# Patient Record
Sex: Female | Born: 1935 | Race: White | Hispanic: No | Smoking: Never smoker
Health system: Southern US, Community
[De-identification: ages and names within clinical notes are randomized; demographics above are authoritative.]

---

## 2018-01-03 ENCOUNTER — Encounter (HOSPITAL_COMMUNITY): Payer: Self-pay | Admitting: Emergency Medicine

## 2018-01-03 ENCOUNTER — Emergency Department (HOSPITAL_COMMUNITY): Payer: Medicare PPO

## 2018-01-03 ENCOUNTER — Other Ambulatory Visit: Payer: Self-pay

## 2018-01-03 ENCOUNTER — Emergency Department (HOSPITAL_COMMUNITY)
Admission: EM | Admit: 2018-01-03 | Discharge: 2018-01-03 | Disposition: A | Discharge status: Home / Self Care | Payer: Medicare PPO | Attending: Emergency Medicine | Admitting: Emergency Medicine

## 2018-01-03 DIAGNOSIS — R1110 Vomiting, unspecified: Secondary | ICD-10-CM | POA: Diagnosis not present

## 2018-01-03 DIAGNOSIS — R42 Dizziness and giddiness: Secondary | ICD-10-CM | POA: Diagnosis present

## 2018-01-03 LAB — URINALYSIS, ROUTINE W REFLEX MICROSCOPIC
Bilirubin Urine: NEGATIVE
Glucose, UA: NEGATIVE mg/dL
Ketones, ur: NEGATIVE mg/dL
Nitrite: NEGATIVE
PROTEIN: NEGATIVE mg/dL
Specific Gravity, Urine: 1.014 (ref 1.005–1.030)
WBC, UA: >50 WBC/hpf — ABNORMAL HIGH (ref 0–5)
pH: 5 (ref 5.0–8.0)

## 2018-01-03 LAB — CBC WITH DIFFERENTIAL/PLATELET
Abs Immature Granulocytes: 0 10*3/uL (ref 0.0–0.1)
BASOS PCT: 1 %
Basophils Absolute: 0.1 10*3/uL (ref 0.0–0.1)
EOS ABS: 0 10*3/uL (ref 0.0–0.7)
EOS PCT: 1 %
HEMATOCRIT: 46.6 % — AB (ref 36.0–46.0)
Hemoglobin: 15.2 g/dL — ABNORMAL HIGH (ref 12.0–15.0)
Immature Granulocytes: 0 %
LYMPHS ABS: 2 10*3/uL (ref 0.7–4.0)
Lymphocytes Relative: 26 %
MCH: 29.4 pg (ref 26.0–34.0)
MCHC: 32.6 g/dL (ref 30.0–36.0)
MCV: 90.1 fL (ref 78.0–100.0)
MONOS PCT: 7 %
Monocytes Absolute: 0.6 10*3/uL (ref 0.1–1.0)
Neutro Abs: 5 10*3/uL (ref 1.7–7.7)
Neutrophils Relative %: 65 %
Platelets: 307 10*3/uL (ref 150–400)
RBC: 5.17 MIL/uL — ABNORMAL HIGH (ref 3.87–5.11)
RDW: 13.7 % (ref 11.5–15.5)
WBC: 7.7 10*3/uL (ref 4.0–10.5)

## 2018-01-03 LAB — COMPREHENSIVE METABOLIC PANEL
ALBUMIN: 4 g/dL (ref 3.5–5.0)
ALT: 12 U/L (ref 0–44)
AST: 19 U/L (ref 15–41)
Alkaline Phosphatase: 79 U/L (ref 38–126)
Anion gap: 10 (ref 5–15)
BUN: 10 mg/dL (ref 8–23)
CO2: 25 mmol/L (ref 22–32)
CREATININE: 0.78 mg/dL (ref 0.44–1.00)
Calcium: 9.5 mg/dL (ref 8.9–10.3)
Chloride: 105 mmol/L (ref 98–111)
GFR calc non Af Amer: >60 mL/min (ref >60)
Glucose, Bld: 110 mg/dL — ABNORMAL HIGH (ref 70–99)
Potassium: 3.8 mmol/L (ref 3.5–5.1)
Sodium: 140 mmol/L (ref 135–145)
Total Bilirubin: 0.7 mg/dL (ref 0.3–1.2)
Total Protein: 7.3 g/dL (ref 6.5–8.1)

## 2018-01-03 LAB — PROTIME-INR
INR: 1.04
PROTHROMBIN TIME: 13.5 s (ref 11.4–15.2)

## 2018-01-03 LAB — LIPASE, BLOOD: Lipase: 40 U/L (ref 11–51)

## 2018-01-03 MED ORDER — MECLIZINE HCL 25 MG PO TABS
25.0000 mg | ORAL_TABLET | Freq: Once | ORAL | Status: AC
  Administered 2018-01-03: 25 mg via ORAL
  Filled 2018-01-03: qty 1

## 2018-01-03 MED ORDER — MECLIZINE HCL 25 MG PO TABS: 25.0000 mg | ORAL_TABLET | Freq: Three times a day (TID) | ORAL | 0 refills | Status: AC | PRN

## 2018-01-03 NOTE — ED Notes (Signed)
RN Romeo AppleBen informed of pt BP

## 2018-01-03 NOTE — ED Notes (Signed)
Pt is aware a urine sample is needed no complaints noted at this time

## 2018-01-03 NOTE — Discharge Instructions (Addendum)
I have prescribed medication to treat vertigo, please take as needed.Please schedule an appointment with your Primary care physician for reevaluation of symptoms within 1 week. Please return to the ED if your symptoms worsen or you experience any of the following symptoms:  You have difficulty moving or speaking. You are always dizzy. You faint. You develop severe headaches. You have weakness in your hands, arms, or legs. You have changes in your hearing or vision. You develop a stiff neck. You develop sensitivity to light.

## 2018-01-03 NOTE — ED Provider Notes (Signed)
[facility] Provider Note   CSN: [id number] Arrival date & time: 01/03/18  1625     History   Chief Complaint Chief Complaint  Patient presents with  . Dizziness  . Emesis    HPI Krista Roy is a 84 y.o. female.  84 y.o female with no PMH resents to the ED with a chief complaint of dizziness, emesis x1 day.  States she opens her eye and watches the room move from side to side not spinning.  Reports is better when she keeps her eyes closed, does not move and hold her head with her hands.  She had a similar episode 2 weeks ago and she thought it was due to the sushi she had that night.  Patient reports she gets dizzy with movement and then vomits. Symptoms are exacerbated by movement. She denies any blood in her vomit.  He denies any chest pain, shortness of breath, dysuria, abdominal pain, headache or weakness.     History reviewed. No pertinent past medical history.  There are no active problems to display for this patient.   History reviewed. No pertinent surgical history.   OB History   None      Home Medications    Prior to Admission medications   Not on File    Family History No family history on file.  Social History Social History   Tobacco Use  . Smoking status: Never Smoker  . Smokeless tobacco: Never Used  Substance Use Topics  . Alcohol use: Never    Frequency: Never  . Drug use: Never     Allergies   Patient has no allergy information on record.   Review of Systems Review of Systems  Constitutional: Negative for chills and fever.  HENT: Negative for ear pain and sore throat.   Eyes: Negative for pain and visual disturbance.  Respiratory: Negative for cough and shortness of breath.   Cardiovascular: Negative for chest pain and palpitations.  Gastrointestinal: Negative for abdominal pain and vomiting.  Genitourinary: Negative for dysuria and hematuria.  Musculoskeletal: Negative for arthralgias  and back pain.  Skin: Negative for color change and rash.  Neurological: Positive for dizziness. Negative for seizures, syncope, weakness, light-headedness, numbness and headaches.  All other systems reviewed and are negative.    Physical Exam Updated Vital Signs BP (!) 184/81 (BP Location: Right Arm)   Pulse 94   Temp 99.2 F (37.3 C) (Oral)   Resp 18   Ht 5\' 5"  (1.651 m)   Wt 63.5 kg   SpO2 96%   BMI 23.30 kg/m   Physical Exam  Constitutional: She is oriented to person, place, and time. She appears well-developed and well-nourished.  HENT:  Head: Normocephalic and atraumatic.  Eyes: Pupils are equal, round, and reactive to light. Right conjunctiva is injected. Left conjunctiva is not injected. Right eye exhibits nystagmus. Right eye exhibits normal extraocular motion. Left eye exhibits nystagmus. Left eye exhibits normal extraocular motion.  BL nystagmus noted. 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   Neck: Normal range of motion. Neck supple.  Cardiovascular: Normal heart sounds.  Pulmonary/Chest: Effort normal and breath sounds normal. She has no wheezes.  Abdominal: Soft. Bowel sounds are normal. There is no tenderness.  Musculoskeletal: She exhibits no tenderness or deformity.  Neurological: She is alert and oriented to person, place, and time.  Skin: Capillary refill takes less than 2 seconds.  Nursing note and vitals reviewed.    ED Treatments / Results  Labs (all labs ordered are listed, but only abnormal results are displayed) Labs Reviewed  CBC WITH DIFFERENTIAL/PLATELET - Abnormal; Notable for the following components:      Result Value   RBC 5.17 (*)    Hemoglobin 15.2 (*)    HCT 46.6 (*)    All other components within normal limits  URINALYSIS, ROUTINE W REFLEX MICROSCOPIC - Abnormal; Notable for the following components:   APPearance HAZY (*)    Hgb urine dipstick SMALL (*)    Leukocytes, UA LARGE (*)    WBC, UA >50 (*)    Bacteria, UA RARE (*)    Non  Squamous Epithelial 0-5 (*)    All other components within normal limits  COMPREHENSIVE METABOLIC PANEL - Abnormal; Notable for the following components:   Glucose, Bld 110 (*)    All other components within normal limits  URINE CULTURE  LIPASE, BLOOD  PROTIME-INR    EKG None  Radiology Dg Chest 2 View  Result Date: 01/03/2018 CLINICAL DATA:  Dizziness and projectile vomiting. EXAM: CHEST - 2 VIEW COMPARISON:  None. FINDINGS: The heart size and mediastinal contours are within normal limits. There is no focal infiltrate, pulmonary edema, or pleural effusion. There is scoliosis of spine with degenerative joint changes of spine. IMPRESSION: No acute abnormality.  No focal pneumonia. Electronically Signed   By: Sherian Rein M.D.   On: 01/03/2018 18:16   Ct Head Wo Contrast  Result Date: 01/03/2018 CLINICAL DATA:  New onset of dizziness.  Nausea and vomiting. EXAM: CT HEAD WITHOUT CONTRAST TECHNIQUE: Contiguous axial images were obtained from the base of the skull through the vertex without intravenous contrast. COMPARISON:  None. FINDINGS: Brain: No acute infarct, hemorrhage, or mass lesion is present. The ventricles are of normal size. No significant extraaxial fluid collection is present. Mild white matter changes are within normal limits for age. The brainstem and cerebellum are normal. Vascular: Atherosclerotic calcifications are present within the cavernous internal carotid arteries bilaterally. There is no hyperdense vessel. Skull: Calvarium is intact. No focal lytic or blastic lesions are present. Sinuses/Orbits: The paranasal sinuses and mastoid air cells are clear. Globes and orbits are within normal limits. IMPRESSION: 1. Normal CT appearance of the brain for age. Electronically Signed   By: Marin Roberts M.D.   On: 01/03/2018 18:06    Procedures Procedures (including critical care time)  Medications Ordered in ED Medications  meclizine (ANTIVERT) tablet 25 mg (25 mg Oral  Given 01/03/18 2118)     Initial Impression / Assessment and Plan / ED Course  I have reviewed the triage vital signs and the nursing notes.  Pertinent labs & imaging results that were available during my care of the patient were reviewed by me and considered in my medical decision making (see chart for details).    Patient presents with positional dizziness x 1 day.  States the dizziness is worse with movement but better when she closes her eyes. CMP showed no electrolyte abnormality, values are within normal limits. CBC slight elevation os RBC,and H&H.  UA showed leukocytes, WBC > 50. And rare bacteria. Urine will be sent for culture. CT head showed no acute infarct, hemorrhage or mass. DG chest 2 view showed no consolidation, pneumothorax or pneumonia.   Antivert given, will reevaluate patient. Patient's condition has improved, will send her home with antivert medication and PCP follow up within 1 week for reevaluation of symptoms.Return precautions provided.   Final Clinical Impressions(s) / ED Diagnoses   Final  diagnoses:  Vertigo  Dizziness    ED Discharge Orders    None       Claude Manges, Cordelia Poche 01/03/18 2212    Marily Memos, MD 01/03/18 2352

## 2018-01-03 NOTE — ED Notes (Signed)
Signature pad not available. Discharge instructions reviewed and patient verbalized understanding.

## 2018-01-03 NOTE — ED Triage Notes (Signed)
Pt to ED with c/o dizziness and vomiting.  Pt st's she had same type symptoms 1 week ago but it subsided.  Pt st's she started having the symptoms again yesterday.  Pt alert and oriented x's 3.

## 2018-01-03 NOTE — ED Provider Notes (Addendum)
Medical screening examination/treatment/procedure(s) were conducted as a shared visit with non-physician practitioner(s) and myself.  I personally evaluated the patient during the encounter.   Sensation of the room spinning associated with some nausea intermittently for the last couple days.  Worse with movement.  Does happen every time she moves but often.  Extinguishes quickly when sitting still.  No chest pain or shortness of breath associated with it.  No syncope. Exam ears are clear, neurologic exam is intact.  Moving her eyes to the left seems to elicit the symptoms. Seems like likely peripheral vertigo.  Blood pressures slightly elevated here however I still think is unlikely to be a central cause for her symptoms.  Will start meclizine with PCP follow-up in ENT follow-up if not improving in a week   Savoy [place], Barbara CowerJason, MD 01/03/18 2351    EKG Interpretation  Date/Time:  Friday January 03 2018 16:56:06 EDT Ventricular Rate:  101 PR Interval:  154 QRS Duration: 70 QT Interval:  346 QTC Calculation: 448 R Axis:   17 Text Interpretation:  Sinus tachycardia Otherwise normal ECG No old tracing to compare Confirmed by Marily MemosMesner, Toryn Dewalt [phone]([id number]) on 01/03/2018 11:52:26 PM         Saddie Sandeen, Barbara CowerJason, MD 01/03/18 2352

## 2018-01-04 LAB — URINE CULTURE

## 2018-12-10 IMAGING — CT CT HEAD W/O CM
4 series · 16 of 47 positions shown, 18 images · non-contrast
Comparison: None.

CLINICAL DATA: New onset of dizziness.  Nausea and vomiting.

EXAM:
CT HEAD WITHOUT CONTRAST
TECHNIQUE: Contiguous axial images were obtained from the base of the skull
through the vertex without intravenous contrast.

[Series 2: head without · axial · non-contrast · 0.45mm/px · z∈[-73,+47]mm · 7 of 32 slices shown, 9 images]
[im 4/32  brain]
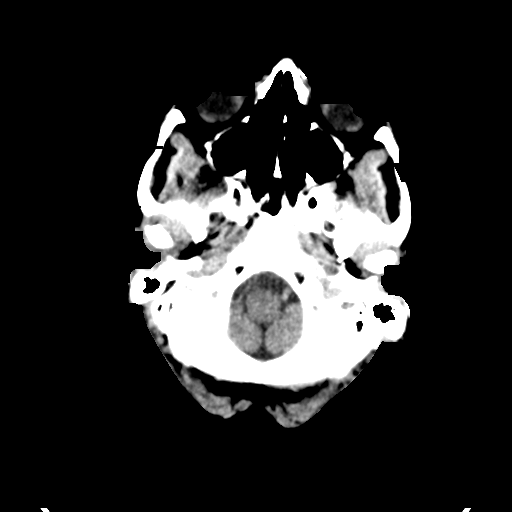
[im 4/32  bone]
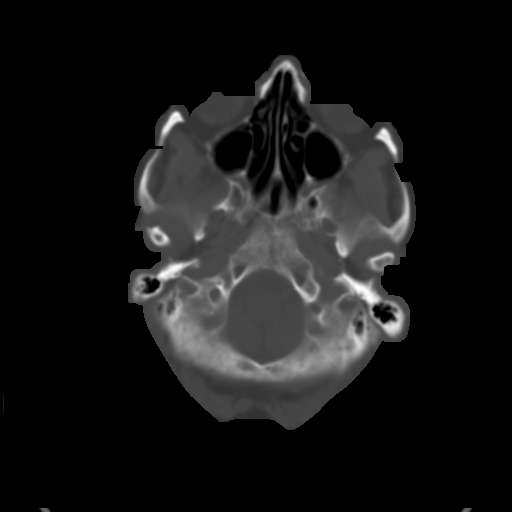
[im 8/32  brain]
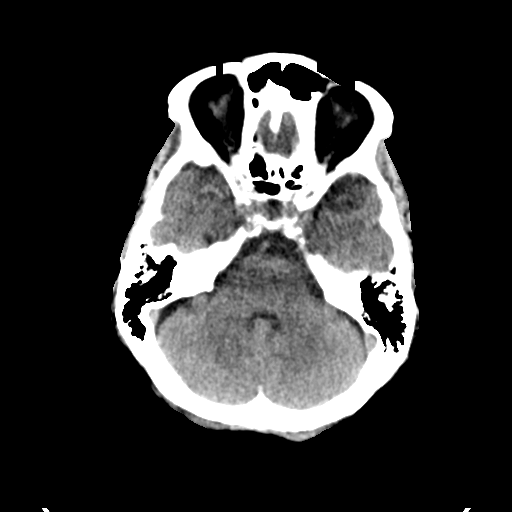
[im 12/32  brain]
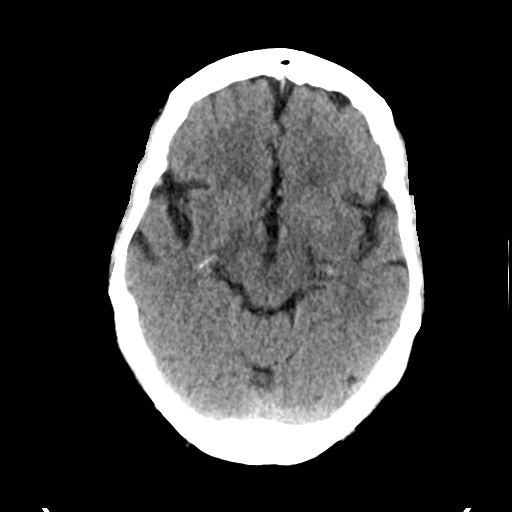
[im 16/32  brain]
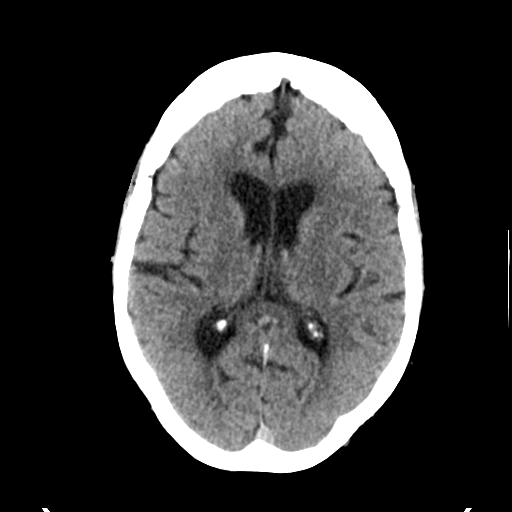
[im 20/32  brain]
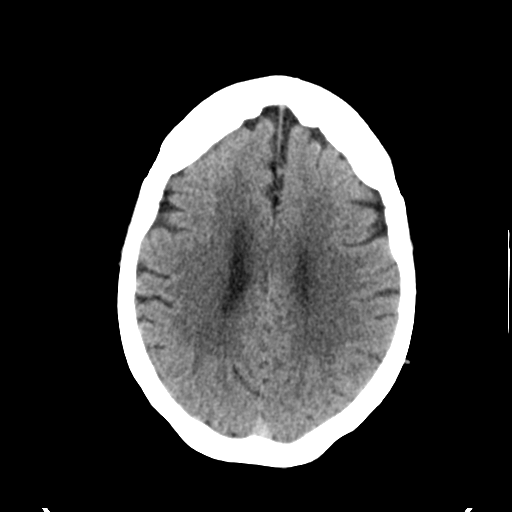
[im 20/32  bone]
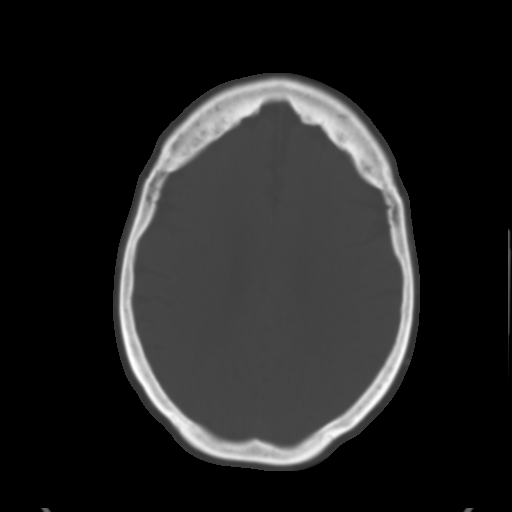
[im 24/32  brain]
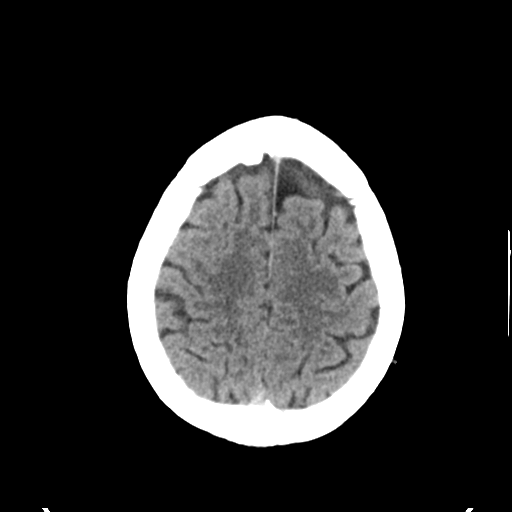
[im 28/32  brain]
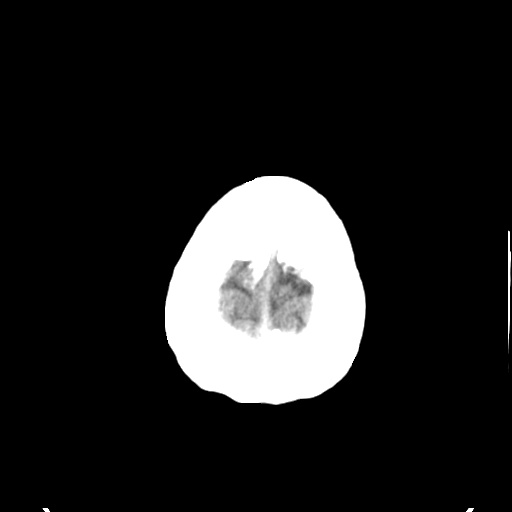

[Series 3: head bone · axial · 0.45mm/px · z∈[-74,-42]mm · 3 of 78 slices shown]
[im 8/78  bone]
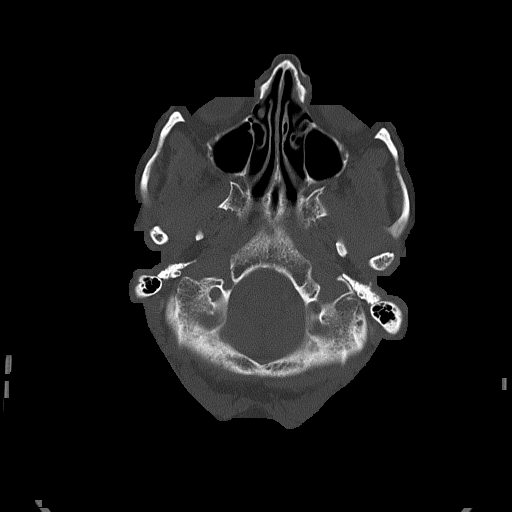
[im 16/78  bone]
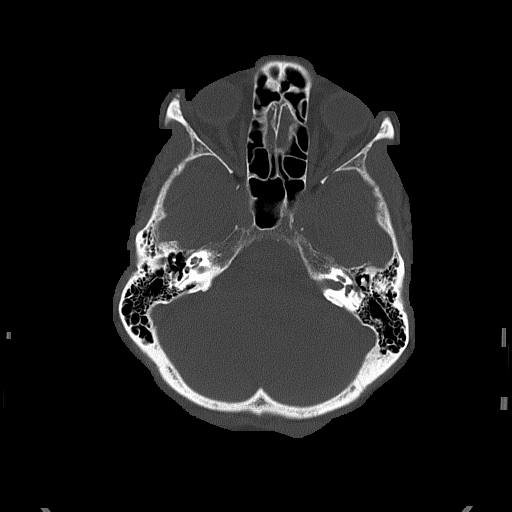
[im 24/78  bone]
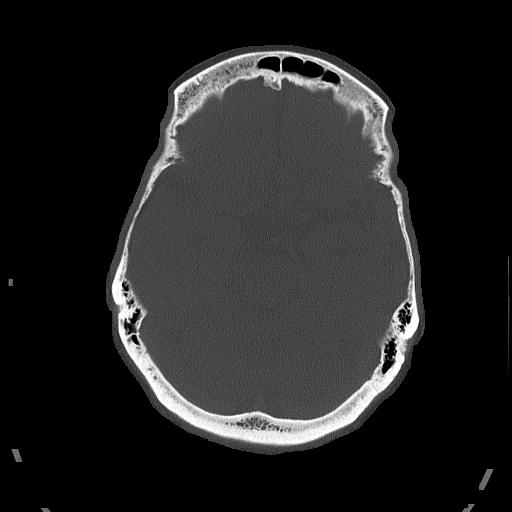

[Series 4: head without cor · coronal · non-contrast · 0.30mm/px · 3 of 69 slices shown]
[im 23/69  brain]
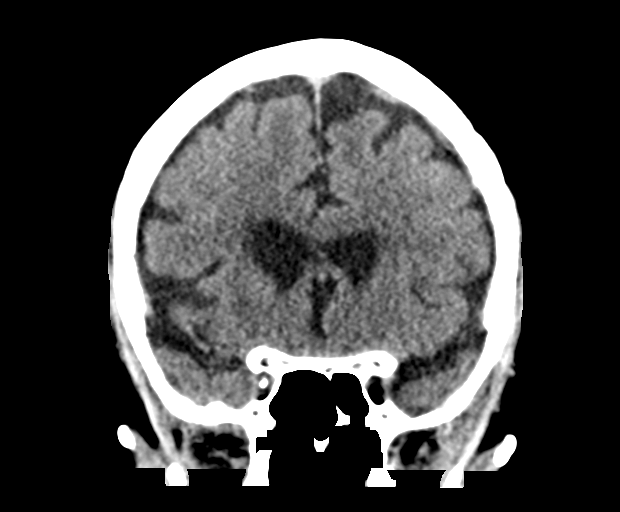
[im 31/69  brain]
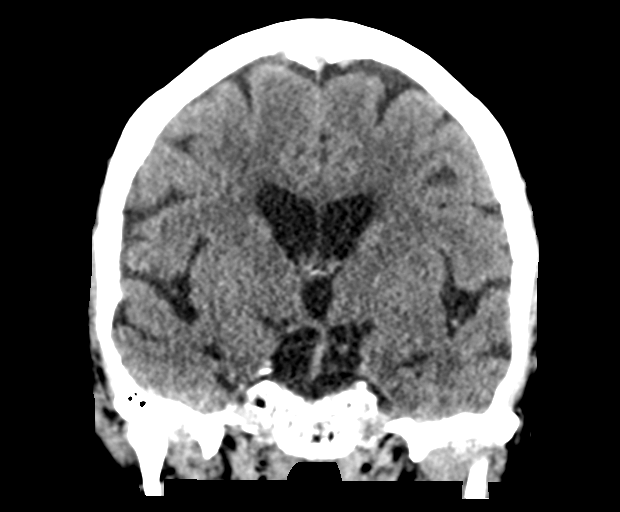
[im 38/69  brain]
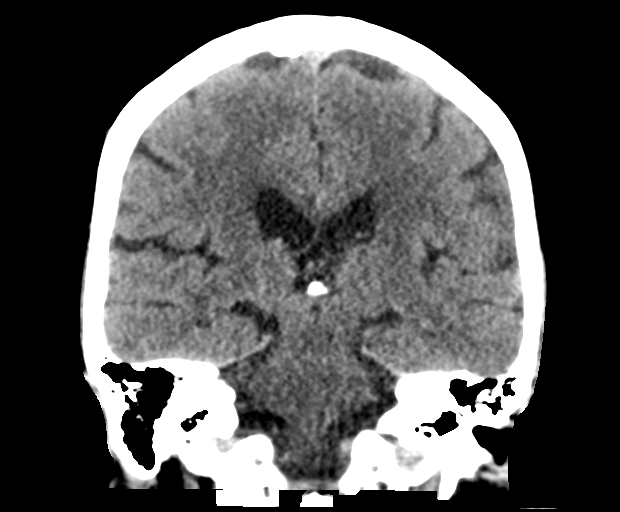

[Series 5: head without sag · sagittal · non-contrast · 0.35mm/px · 3 of 67 slices shown]
[im 23/67  brain]
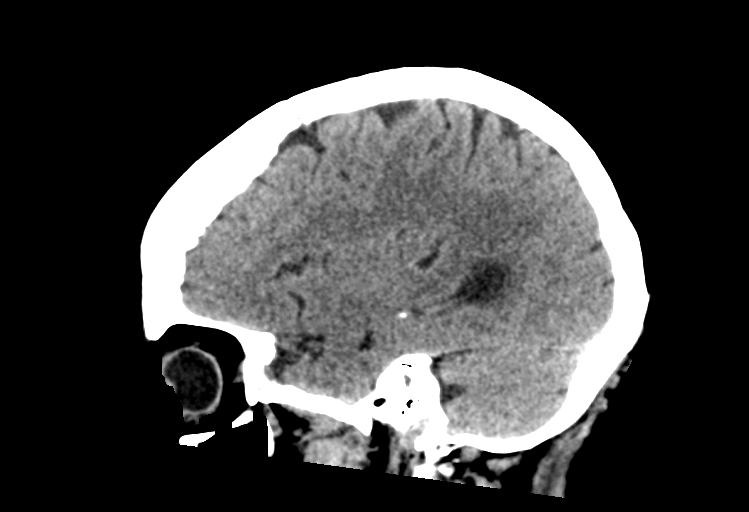
[im 34/67  brain]
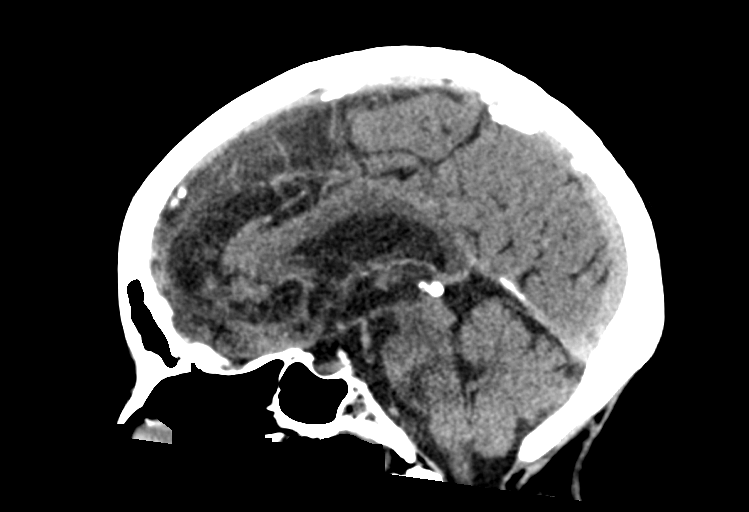
[im 45/67  brain]
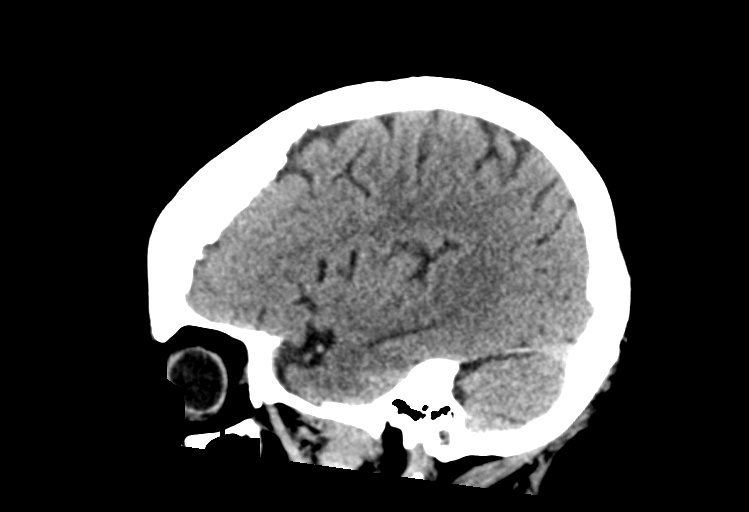

[16 of 47 positions shown; findings below may reference images not displayed]

FINDINGS: Brain: No acute infarct, hemorrhage, or mass lesion is present. The
ventricles are of normal size. No significant extraaxial fluid
collection is present.

Mild white matter changes are within normal limits for age. The
brainstem and cerebellum are normal.

Vascular: Atherosclerotic calcifications are present within the
cavernous internal carotid arteries bilaterally. There is no
hyperdense vessel.

Skull: Calvarium is intact. No focal lytic or blastic lesions are
present.

Sinuses/Orbits: The paranasal sinuses and mastoid air cells are
clear. Globes and orbits are within normal limits.
IMPRESSION: 1. Normal CT appearance of the brain for age.

## 2023-05-13 ENCOUNTER — Telehealth (INDEPENDENT_AMBULATORY_CARE_PROVIDER_SITE_OTHER): Payer: Self-pay | Admitting: Otolaryngology

## 2023-05-13 NOTE — Telephone Encounter (Signed)
We received a fax stating Georgiana Spinner was the Health Care POA for this patient.  The fax was hard to read and was not witnessed or notarized and therefore can not be accepted.  Georgiana Spinner called today to follow up on the paperwork and was asked to bring a notarized copy of the paperwork to the appt on 05/17/23 w/ Dr. Irene Pap and she agreed to do so.

## 2023-05-16 ENCOUNTER — Institutional Professional Consult (permissible substitution) (INDEPENDENT_AMBULATORY_CARE_PROVIDER_SITE_OTHER): Payer: Medicare PPO | Admitting: Otolaryngology

## 2023-05-17 ENCOUNTER — Ambulatory Visit (INDEPENDENT_AMBULATORY_CARE_PROVIDER_SITE_OTHER): Payer: Medicare HMO | Admitting: Otolaryngology

## 2023-05-17 ENCOUNTER — Encounter (INDEPENDENT_AMBULATORY_CARE_PROVIDER_SITE_OTHER): Payer: Self-pay | Admitting: Otolaryngology

## 2023-05-17 VITALS — BP 179/73 | HR 84 | Ht 65.0 in | Wt 142.0 lb

## 2023-05-17 DIAGNOSIS — R09A2 Foreign body sensation, throat: Secondary | ICD-10-CM | POA: Diagnosis not present

## 2023-05-17 DIAGNOSIS — K219 Gastro-esophageal reflux disease without esophagitis: Secondary | ICD-10-CM

## 2023-05-17 DIAGNOSIS — R0981 Nasal congestion: Secondary | ICD-10-CM

## 2023-05-17 DIAGNOSIS — R1310 Dysphagia, unspecified: Secondary | ICD-10-CM

## 2023-05-17 DIAGNOSIS — R053 Chronic cough: Secondary | ICD-10-CM

## 2023-05-17 DIAGNOSIS — J342 Deviated nasal septum: Secondary | ICD-10-CM

## 2023-05-17 DIAGNOSIS — J3089 Other allergic rhinitis: Secondary | ICD-10-CM

## 2023-05-17 DIAGNOSIS — R0989 Other specified symptoms and signs involving the circulatory and respiratory systems: Secondary | ICD-10-CM

## 2023-05-17 DIAGNOSIS — R0982 Postnasal drip: Secondary | ICD-10-CM | POA: Diagnosis not present

## 2023-05-17 DIAGNOSIS — J300 Vasomotor rhinitis: Secondary | ICD-10-CM

## 2023-05-17 MED ORDER — IPRATROPIUM BROMIDE 0.03 % NA SOLN: 2.0000 | Freq: Two times a day (BID) | NASAL | 12 refills | Status: AC

## 2023-05-17 MED ORDER — FLUTICASONE PROPIONATE 50 MCG/ACT NA SUSP: 2.0000 | Freq: Every day | NASAL | 6 refills | Status: AC

## 2023-05-17 NOTE — Progress Notes (Unsigned)
ENT CONSULT:  Reason for Consult: chronic cough for 1 year    HPI: Discussed the use of AI scribe software for clinical note transcription with the patient, who gave verbal consent to proceed.  History of Present Illness   The patient, a 89 year old individual residing in an assisted living facility, presents with a chronic cough of nearly one year's duration, accompanied by family member. The cough is described as productive, with the patient reporting significant amounts of white or clear mucus. The patient notes that the mucus accumulates particularly when lying down, leading to increased coughing episodes at night. The cough is also reported to worsen after meals, although there is no reported difficulty swallowing or choking with food or liquids.  The patient has a history of COVID-19 infection, which occurred several months prior to the onset of the cough. She also reports a history of childhood asthma, which resolved by the time she started kindergarten. There is no reported history of heartburn or reflux, and no known allergies.  The patient has sought multiple medical opinions for this issue, including visits to the emergency room, consultations with two pulmonologists, a cardiologist. Various treatments have been tried, including prednisone, albuterol, Mucinex, and Flonase, with no significant improvement. The patient has declined bronchoscopy when seeing Pulmonary. She is not interested in anything invasive for workup or interventions.   The patient also reports a correlation between the severity of the cough and periods of emotional stress. She has recently started taking a small dose of Xanax, which she reports has improved her sleep. Night time is when cough used to wake her up at night.   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 Despite the chronic cough and mucus production, lung examinations have reportedly been clear. She had normal PFTs and no  concerning findings on CT chest per report review.   The patient's primary concern is the mucus production, which she describes as the cause of the cough. She reports that leaning forward significantly helps to expel the mucus. Despite the chronic nature of the cough, it is not reported to be disruptive to daily activities outside of the patient's room.     Records Reviewed:  04/01/23 History of Present Illness: Krista Roy is an 89 y.o. female former smoker who presents for evaluation and management of chronic productive cough since Feb 2024. She has been evaluated by outside pulmonary. CT chest was without parenchymal lung disease or bronchiectasis. PFTs showed reduced FEV1 and FVC normal ratio. She has tried prednisone, albuterol, and mucinex. None have worked. She is under a bit of stress which she feels makes it worse. She does not feel that she is aspirating. She notes that eating and drinking makes the cough better. She gets clear to white mucus out and feels relief when she does. It is worse at night. She is using flonase. She has been evaluated by cardiology and they do not feel this is related to her heart.   Interval Visit 04/01/2023: Since our last visit she completed a course of Levaquin without improvement. Flutter valve helps a little but she still reports a lot of mucus that feels stuck in her throat. She is using flonase every night and nasal saline spray. She had an episode of sneezing recently that gave her complete relief when all of the mucus came out. We discussed referral to ENT and she agrees. We discussed bronchoscopy and risks associated and she agreed to defer. She notes that  her son had a bronchoscopy and ended up with tracheal fracture and tracheostomy. She does not ever wish to have a bronchoscopy.   Assessment and Plan:   1. Cough, chronic and productive - advised flonase and Zyrtec before bed. - flutter valve 10 times twice a day - referral to ENT placed and  faxed to preferred group [phone] - recommend nasal rinses   RTC in 3 months   ED visit note 02/02/23 Shortness of Breath  Pt BIB EMS from [facility] - Pt reports persistent, productive cough x6 mo - Pt has been ruled out for pneumonia and has been on two doses of antibiotics with little to no improvement - Pt reports that she is unable to get much rest and reprots that SOB and tightness is worse with laying down - Pt is on no O2 at baseline - EMS reports wheezing and rhonchi - Gave 5mg  Albuterol and 0.5 of Atrovent en route   89 year old female presents brought in by EMS from [facility] reporting persistent productive cough x 6 months. Has been treated for pneumonia with 2 rounds of antibiotic with no improvement. Reports she is unable to get much sleep at night. She starts feeling she has a lot of phlegm and mucus in her chest and throat. This causes her to be short of breath and also causes difficulty sleeping. Reports it sounds like her lungs are usually very noisy. She received 5 mg albuterol and 0.5 of Atrovent en route from EMS, reports this is the best she is felt in a long time. Works much better than her nebulizers at home. No baseline O2. No chest pain, abdominal pain, nausea or vomiting.   Office visit with [facility] 10/16/22 [facility] Chest Specialist   Plan  1. Chronic cough (Primary) - Cough started ~ 4 months ago. Worse at night and also occurs with eating foods. No medication changes. - Recently seen in the ED on 10/01/2022 where she received further work-up of cough and SOB with the following: *CTPA - no PEs. Portions of the right lower lobe segmental artery are obscured by motion artifact  *Dopplers of lower extremities - no DVTs *CXR - no acute lung findings  *ABG - pH 7.41, CO2 41, O2 75, HCO3- 26. *Tropinins - 14 then 10 after 3 hrs. *CBC, BMP - Normal overall - Spirometry (PFT) - suspected lung restriction. FEV1/FVC 97%, FEV1 60% pred, FVC 61% pred - Diffusing capacity  (DLCO) - severe reduction in diffusing capacity. DLCO 41% pred - Patient declined wearing supplemental oxygen, so was not performed. - Consider referral to GI to assess swallow if symptoms continue. - Echocardiogram Complete WO Enhancing Agent - Patient will start fluticasone furoate (ARNUITY ELLIPTA) 200 mcg/actuation inhalation powder - The proper method of use, as well as anticipated side effects, of this metered-dose inhaler are discussed and demonstrated to the patient. - albuterol sulfate HFA (VENTOLIN HFA) 108 (90 Base) MCG/ACT inhaler  2. Lower extremity edema - Echocardiogram Complete WO Enhancing Agent - If Echo results are abnormal, will refer to cardiology  Follow up in about 3 months (around 01/16/2023) for Medication check. (patient will call sooner if symptoms change).  HPI  Chief Complaint: Cough  Alvera Ransier is a 89 y.o. female who presents for a new visit/consultation regarding cough and SOB . Mrs. Yarbrough was referred by Rodalyn Dwain Sarna, PA. Ms. Mckenna cough started ~ 4 months ago shortly after a visit from her son-in-law. The cough has occurred off and on during this  time. Along with coughing, she reports some wheezing, congestion, LEE, some issues with cough after eating, and shortness of breath. Her symptoms are typically worse at night. No symptoms related to acid reflux or recent medication changes. She was recently seen at urgent care for these symptoms and was sent to the ED for further work-up. CTPA, dopplers of lower extremities, and CXR were all normal. PFTs completed today illustrate lung restriction. DLCO shows severe reduction in diffusing capacity. Ms. Sannes has no known history of occupational exposures or connective tissue disorders. No family history of pulmonary diseases or lung cancer. She had a brief smoking history when she was a teenager.   I have recommended starting Arnuity daily inhaler therapy and PRN Albuterol. We have placed an order  for an echo to be completed to assess heart valves. Ms. Copado should continue wearing support hose to help with LEE. If symptoms continue, we will plan to refer her to GI for swallow eval and cardiology for complete cardiac work-up.  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 Technique rectified with demonstration. Patient verbalized understanding.  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 No pertinent surgical history.  Social History   Substance and Sexual Activity  Alcohol Use Not Currently   Social History   Tobacco Use  Smoking Status Former  Packs/day: 0.50  Years: 8.00  Additional pack years: 0.00  Total pack years: 4.00  Types: Cigarettes  Start date: 27  Quit date: 61  Years since quitting: 69.4  Smokeless Tobacco Never    Social History:  reports that she has never smoked. She has never used smokeless tobacco. She reports that she does not drink alcohol and does not use drugs.  Tobacco Use  Smoking Status Former  Packs/day: 0.50  Years: 8.00  Additional pack years: 0.00  Total pack years: 4.00  Types: Cigarettes  Start date: 76  Quit date: 22  Years since quitting: 69.7  Smokeless Tobacco Never    Allergies: Not on File  Medications: I have reviewed the patient's current medications.  The PMH, PSH, Medications, Allergies, and SH were reviewed and updated.  ROS: Constitutional: Negative for fever, weight loss and weight gain. Cardiovascular: Negative for  chest pain and dyspnea on exertion. Respiratory: Is not experiencing shortness of breath at rest. Gastrointestinal: Negative for nausea and vomiting. Neurological: Negative for headaches. Psychiatric: The patient is not nervous/anxious  Blood pressure (!) 179/73, pulse 84, height 5\' 5"  (1.651 m), weight 142 lb (64.4 kg), SpO2 93%.  PHYSICAL EXAM:  Exam: General: Well-developed, well-nourished Communication and Voice: Clear pitch and clarity Respiratory Respiratory effort: Equal inspiration and expiration without stridor Cardiovascular Peripheral Vascular: Warm extremities with equal color/perfusion Eyes: No nystagmus with equal extraocular motion bilaterally Neuro/Psych/Balance: Patient oriented to person, place, and time; Appropriate mood and affect; Gait is intact with no imbalance; Cranial nerves I-XII are intact Head and Face Inspection: Normocephalic and atraumatic without mass or lesion Palpation: Facial skeleton intact without bony stepoffs Salivary Glands: No mass or tenderness Facial Strength: Facial motility symmetric and full bilaterally ENT  Pinna: External ear intact and fully developed External canal: Canal is patent with intact skin Tympanic Membrane: Clear and mobile External Nose: No scar or anatomic deformity Internal Nose: Septum is ***. No polyp, or purulence. Mucosal edema and erythema present.  Bilateral inferior turbinate hypertrophy.  Lips, Teeth, and gums: Mucosa and teeth intact and viable TMJ: No pain to palpation with full mobility Oral cavity/oropharynx: No erythema or exudate, no lesions present Nasopharynx: No mass or lesion with intact mucosa Hypopharynx: Intact mucosa without pooling of secretions Larynx Glottic: Full true vocal cord mobility without lesion or mass Supraglottic: Normal appearing epiglottis and AE folds Interarytenoid Space: Moderate pachydermia&edema Subglottic Space: Patent without lesion or edema Neck Neck and Trachea:  Midline trachea without mass or lesion Thyroid: No mass or nodularity Lymphatics: No lymphadenopathy  Procedure:  Preoperative diagnosis:  Postoperative diagnosis:   Same  Procedure: Flexible fiberoptic laryngoscopy  Surgeon: Ashok Croon, MD  Anesthesia: Topical lidocaine and Afrin Complications: None Condition is stable throughout exam  Indications and consent:  The patient presents to the clinic with Indirect laryngoscopy view was incomplete. Thus it was recommended that they undergo a flexible fiberoptic laryngoscopy. All of the risks, benefits, and potential complications were reviewed with the patient preoperatively and verbal informed consent was obtained.  Procedure: The patient was seated upright in the clinic. Topical lidocaine and Afrin were applied to the nasal cavity. After adequate anesthesia had occurred, I then proceeded to pass the flexible telescope into the nasal cavity. The nasal cavity was patent without rhinorrhea or polyp. The nasopharynx was also patent without mass or lesion. The base of tongue was visualized and was normal. There were no signs of pooling of secretions in the piriform sinuses. The true vocal folds were mobile bilaterally. There were no signs of glottic or supraglottic mucosal lesion or mass. There was *** interarytenoid pachydermia and post cricoid edema. The telescope was then slowly withdrawn and the patient tolerated the procedure throughout.    PROCEDURE NOTE: nasal endoscopy  Preoperative diagnosis: chronic sinusitis symptoms  Postoperative diagnosis: same  Procedure: Diagnostic nasal endoscopy (40981)  Surgeon: Ashok Croon, M.D.  Anesthesia: Topical lidocaine and Afrin  H&P REVIEW: The patient's history and physical were reviewed today prior to procedure. All medications were reviewed and updated as well. Complications: None Condition is stable throughout exam Indications and consent: The patient presents with symptoms of  chronic sinusitis not responding to previous therapies. All the risks, benefits, and potential complications were reviewed with the patient preoperatively and informed consent was obtained. The time out was completed with confirmation of the correct procedure.   Procedure: The patient was seated upright in the clinic. Topical lidocaine and Afrin were applied to the nasal cavity. After adequate anesthesia had occurred, the rigid nasal endoscope was passed into the nasal cavity. The nasal mucosa, turbinates, septum, and sinus drainage pathways were visualized bilaterally. This revealed *** purulence or significant secretions that might be cultured. There were *** polyps or sites of significant inflammation. The mucosa was intact and there *** crusting present. The scope was then slowly withdrawn and the patient tolerated the procedure well. There were no complications or blood loss.     Studies Reviewed: CT chest - report RADIOLOGY CT Chest done outside 10/01/2022 - images not available for review  TRACHEA: The central tracheobronchial tree is patent. PARENCHYMA: No focal consolidative airspace opacities are seen.  PLEURA: No discrete pleural effusions are seen. There is no evidence of a pneumothorax.   Assessment/Plan: Encounter Diagnoses  Name Primary?   Dysphagia, unspecified type    Chronic cough Yes   Globus sensation    Chronic GERD    Chronic throat clearing    Post-nasal drip    Chronic nasal congestion    Environmental and seasonal allergies    Vasomotor rhinitis     Assessment and Plan    Chronic Cough   Chronic cough persisting for nearly a year, primarily after eating and at night when she lies down, and exacerbated by mucus production. Sensation of mucus is the main issue for her and she constantly wants to clear her throat or cough.   From extensive prior workup with Pulm and Cards:  "Spirometry (PFT) - suspected lung restriction. FEV1/FVC 97%, FEV1 60% pred, FVC 61%  pred - Diffusing capacity (DLCO) - severe reduction in diffusing capacity. DLCO 41% pred - Patient declined wearing supplemental oxygen, so was not performed. - Consider referral to GI to assess swallow if symptoms continue. - Echocardiogram Complete WO Enhancing Agent - Patient will start fluticasone furoate (ARNUITY ELLIPTA) 200 mcg/actuation inhalation powder - The proper method of use, as well as anticipated side effects, of this metered-dose inhaler are discussed and demonstrated to the patient. - albuterol sulfate HFA (VENTOLIN HFA) 108 (90 Base) MCG/ACT inhaler"  She does have CHF and wears compression socks for leg edema. No evidence of asthma or significant lung disease on imaging and pulmonary function tests. ENT evaluation revealed mild swelling above the esophageal sphincter, suggesting possible reflux. Differential includes postnasal drainage, untreated reflux, and neurogenic cough. Discussed esophagram for evaluating swallow safety and potential hiatal hernia. Explained barium swallow test is well-tolerated with minimal gastrointestinal side effects. Discussed neurogenic cough and stress exacerbation. Patient expressed concerns about further testing given her age and prognosis.   - Order esophagram to evaluate swallow safety and check for hiatal hernia   - Prescribe ipratropium bromide nasal spray with Flonase   - Provide instructions on mucus management and reflux control   - Recommend Reflux Gourmet (seaweed supplement) after dinner   - Refer to speech therapist for cough suppression therapy    Septal Deviation   Septal deviation noted, right side more open. No significant nasal congestion observed.   - Continue nasal spray regimen with Flonase and ipratropium bromide    General Health Maintenance   89 year old in assisted living. No specific issues discussed.   - Provide after-visit summary with instructions for mucus management and reflux control    Follow-up   -  Schedule follow-up visit after esophagram   - Ensure after-visit summary is provided to patient and facility staff.        Thank you for allowing me to participate in the care of this patient. Please do not hesitate to contact me with any questions or concerns.   Ashok Croon, MD Otolaryngology [facility] Phone: [phone] Fax: [phone]    05/17/2023, 10:13 AM

## 2023-05-17 NOTE — Patient Instructions (Signed)
- consider Barium Swallow  - start Ipratropium Bromide and continue Flonase - try Reflux Gourmet   - Take Reflux Gourmet (natural supplement available on Amazon) to help with symptoms of chronic throat irritation      GamingLesson.nl - check out this website to learn more about reflux   -Avoid lying down for at least two hours after a meal or after drinking acidic beverages, like soda, or other caffeinated beverages. This can help to prevent stomach contents from flowing back into the esophagus. -Keep your head elevated while you sleep. Using an extra pillow or two can also help to prevent reflux. -Eat smaller and more frequent meals each day instead of a few large meals. This promotes digestion and can aid in preventing heartburn. -Wear loose-fitting clothes to ease pressure on the stomach, which can worsen heartburn and reflux. -Reduce excess weight around the midsection. This can ease pressure on the stomach. Such pressure can force some stomach contents back up the esophagus  MUCUS MANAGEMENT  Several factors can cause the sensation of increased mucus in the throat, including dryness, acid reflux, and increased mucus production from allergies or chronic sinus drainage.  There is also some evidence that added sugars or processed sugars in the diet (not the kind that occur naturally in honey or ripe fruit) can increase mucus, as well as too much dairy or refined carbohydrates. To avoid these refined carbohydrates, on food labels, watch out for "wheat flour" (also called "white," "refined" or "enriched" flour) on the ingredients list. The below website has several good options for mucus management.  leedsportal.com.pdf  Some recommendations: -Water, water, water -Cut back on caffeine -Steam treatments during the day (Personal QUALCOMM are available at [employer] and drugstores. Amazon sells one called Vick's steam inhaler that people  like. A cheaper alternative is a bowl of warm water with a towel over your head. You can breathe in the steam for a couple of minutes, especially if you are about to use your voice a lot, or when you are feeling particularly dry in your throat or mouth.) -Humidifier at night. Put this right next to your head so that the mist falls on your face. Do be aware that indoor dampness can promote bacteria, mold, and dust mite growth, so if you have a dust mite allergy you should be sure to use mattress and pillow covers and avoid excessive humidification. -Prevention of acid reflux by avoiding late-night eating (nothing 3 hours before laying down at night), greasy and spicy foods, and acidic foods -Mucinex (over-the-counter, generic name guaifenesin. Buy the kind without a cough suppressant or decongestant added). This medicine doesn't work well if you are not well hydrated, so drink plenty of water on days when you take it. -Xylimelts -Nasal saline spray such as Simply Saline (or any nasal saline without preservatives, in 0.9% concentration) -Nasal saline irrigations with NeilMed rinse kit, Neti Pot, Active Sinus, or Nasopure irrigation bottles (available in any pharmacy or grocery store, and all available on [place]) -Avoid the things you are allergic to if you have allergies (use dust mite covers on your bed, wash your hair at night instead of morning if you have pollen allergies) - see this website for more information on managing allergies if this is a problem for you: www.sinusallergy.http://hicks.info/ -Hall's Breezers (sugar-free), Grether's black currant pastilles, and Entertainer's Secret Throat Relief can all help dry mouth and thickened mucous. See website above for details. -If you smoke, stop. -Low-processed-sugar diet. Processed sugars may increase  mucus and even generalized inflammation in the body. The FDA is changing the way they label foods soon so that it's easier to tell when sugars occur naturally in the  food (which is not harmful to our health) vs when processed sugars or syrups have been added to the food (which may increase inflammation in the body).  In the meantime, you can research the "plant-based diet," "anti-inflammatory diet," "mediterranean diet," or the "paleo diet" to get an idea of foods that you might want to avoid. -Dairy - some patients find that eating a lot of dairy products worsens their mucus. -Sleep apnea - if you have sleep apnea and don't treat it, consider starting up your CPAP again (and be sure to use the humidification). Snoring and struggling to keep your airway open all night long is traumatizing to the lining of your throat and can increase irritation.             Travel steam inhalers/humidifiers: www.amandaflynnvoice.com http://www.amazon.com/Air-O-Swiss-7146-Travel-Ultrasonic-Humidifier/dp/B001JL4LZ4? https://www.jenkins-webster.com/

## 2023-07-19 ENCOUNTER — Ambulatory Visit (INDEPENDENT_AMBULATORY_CARE_PROVIDER_SITE_OTHER): Payer: Medicare HMO | Admitting: Otolaryngology
